# Patient Record
Sex: Male | Born: 1985 | Race: Black or African American | Hispanic: No | Marital: Single | Smoking: Current every day smoker
Health system: Southern US, Community
[De-identification: ages and names within clinical notes are randomized; demographics above are authoritative.]

---

## 2014-01-30 ENCOUNTER — Emergency Department: Payer: Self-pay | Admitting: Internal Medicine

## 2016-01-11 ENCOUNTER — Emergency Department: Payer: Self-pay

## 2016-01-11 DIAGNOSIS — Y280XXA Contact with sharp glass, undetermined intent, initial encounter: Secondary | ICD-10-CM | POA: Insufficient documentation

## 2016-01-11 DIAGNOSIS — F17200 Nicotine dependence, unspecified, uncomplicated: Secondary | ICD-10-CM | POA: Insufficient documentation

## 2016-01-11 DIAGNOSIS — Y9389 Activity, other specified: Secondary | ICD-10-CM | POA: Insufficient documentation

## 2016-01-11 DIAGNOSIS — S61412A Laceration without foreign body of left hand, initial encounter: Secondary | ICD-10-CM | POA: Insufficient documentation

## 2016-01-11 DIAGNOSIS — S61411A Laceration without foreign body of right hand, initial encounter: Secondary | ICD-10-CM | POA: Insufficient documentation

## 2016-01-11 DIAGNOSIS — Y998 Other external cause status: Secondary | ICD-10-CM | POA: Insufficient documentation

## 2016-01-11 DIAGNOSIS — Y9289 Other specified places as the place of occurrence of the external cause: Secondary | ICD-10-CM | POA: Insufficient documentation

## 2016-01-11 NOTE — ED Notes (Signed)
Pt with lac to right hand put it through glass, lac noted anterior left hand.  Has full movement and sensation of all fingers and hand.

## 2016-01-12 ENCOUNTER — Emergency Department
Admission: EM | Admit: 2016-01-12 | Discharge: 2016-01-12 | Disposition: A | Discharge status: Home / Self Care | Payer: Self-pay | Attending: Emergency Medicine | Admitting: Emergency Medicine

## 2016-01-12 ENCOUNTER — Encounter: Payer: Self-pay | Admitting: Emergency Medicine

## 2016-01-12 DIAGNOSIS — Y9289 Other specified places as the place of occurrence of the external cause: Secondary | ICD-10-CM | POA: Insufficient documentation

## 2016-01-12 DIAGNOSIS — S60413A Abrasion of left middle finger, initial encounter: Secondary | ICD-10-CM | POA: Insufficient documentation

## 2016-01-12 DIAGNOSIS — S60221A Contusion of right hand, initial encounter: Secondary | ICD-10-CM | POA: Insufficient documentation

## 2016-01-12 DIAGNOSIS — S61412A Laceration without foreign body of left hand, initial encounter: Secondary | ICD-10-CM | POA: Insufficient documentation

## 2016-01-12 DIAGNOSIS — S61401A Unspecified open wound of right hand, initial encounter: Secondary | ICD-10-CM | POA: Insufficient documentation

## 2016-01-12 DIAGNOSIS — F17200 Nicotine dependence, unspecified, uncomplicated: Secondary | ICD-10-CM | POA: Insufficient documentation

## 2016-01-12 DIAGNOSIS — Y998 Other external cause status: Secondary | ICD-10-CM | POA: Insufficient documentation

## 2016-01-12 DIAGNOSIS — S60415A Abrasion of left ring finger, initial encounter: Secondary | ICD-10-CM | POA: Insufficient documentation

## 2016-01-12 DIAGNOSIS — W25XXXA Contact with sharp glass, initial encounter: Secondary | ICD-10-CM | POA: Insufficient documentation

## 2016-01-12 DIAGNOSIS — Y9389 Activity, other specified: Secondary | ICD-10-CM | POA: Insufficient documentation

## 2016-01-12 MED ORDER — IBUPROFEN 800 MG PO TABS: 800.0000 mg | ORAL_TABLET | Freq: Three times a day (TID) | ORAL | Status: AC | PRN

## 2016-01-12 MED ORDER — MUPIROCIN CALCIUM 2 % EX CREA: TOPICAL_CREAM | CUTANEOUS | Status: AC

## 2016-01-12 NOTE — Discharge Instructions (Signed)
Cryotherapy °Cryotherapy means treatment with cold. Ice or gel packs can be used to reduce both pain and swelling. Ice is the most helpful within the first 24 to 48 hours after an injury or flare-up from overusing a muscle or joint. Sprains, strains, spasms, burning pain, shooting pain, and aches can all be eased with ice. Ice can also be used when recovering from surgery. Ice is effective, has very few side effects, and is safe for most people to use. °PRECAUTIONS  °Ice is not a safe treatment option for people with: °· Raynaud phenomenon. This is a condition affecting small blood vessels in the extremities. Exposure to cold may cause your problems to return. °· Cold hypersensitivity. There are many forms of cold hypersensitivity, including: °¨ Cold urticaria. Red, itchy hives appear on the skin when the tissues begin to warm after being iced. °¨ Cold erythema. This is a red, itchy rash caused by exposure to cold. °¨ Cold hemoglobinuria. Red blood cells break down when the tissues begin to warm after being iced. The hemoglobin that carry oxygen are passed into the urine because they cannot combine with blood proteins fast enough. °· Numbness or altered sensitivity in the area being iced. °If you have any of the following conditions, do not use ice until you have discussed cryotherapy with your caregiver: °· Heart conditions, such as arrhythmia, angina, or chronic heart disease. °· High blood pressure. °· Healing wounds or open skin in the area being iced. °· Current infections. °· Rheumatoid arthritis. °· Poor circulation. °· Diabetes. °Ice slows the blood flow in the region it is applied. This is beneficial when trying to stop inflamed tissues from spreading irritating chemicals to surrounding tissues. However, if you expose your skin to cold temperatures for too long or without the proper protection, you can damage your skin or nerves. Watch for signs of skin damage due to cold. °HOME CARE INSTRUCTIONS °Follow  these tips to use ice and cold packs safely. °· Place a dry or damp towel between the ice and skin. A damp towel will cool the skin more quickly, so you may need to shorten the time that the ice is used. °· For a more rapid response, add gentle compression to the ice. °· Ice for no more than 10 to 20 minutes at a time. The bonier the area you are icing, the less time it will take to get the benefits of ice. °· Check your skin after 5 minutes to make sure there are no signs of a poor response to cold or skin damage. °· Rest 20 minutes or more between uses. °· Once your skin is numb, you can end your treatment. You can test numbness by very lightly touching your skin. The touch should be so light that you do not see the skin dimple from the pressure of your fingertip. When using ice, most people will feel these normal sensations in this order: cold, burning, aching, and numbness. °· Do not use ice on someone who cannot communicate their responses to pain, such as small children or people with dementia. °HOW TO MAKE AN ICE PACK °Ice packs are the most common way to use ice therapy. Other methods include ice massage, ice baths, and cryosprays. Muscle creams that cause a cold, tingly feeling do not offer the same benefits that ice offers and should not be used as a substitute unless recommended by your caregiver. °To make an ice pack, do one of the following: °· Place crushed ice or a   bag of frozen vegetables in a sealable plastic bag. Squeeze out the excess air. Place this bag inside another plastic bag. Slide the bag into a pillowcase or place a damp towel between your skin and the bag.  Mix 3 parts water with 1 part rubbing alcohol. Freeze the mixture in a sealable plastic bag. When you remove the mixture from the freezer, it will be slushy. Squeeze out the excess air. Place this bag inside another plastic bag. Slide the bag into a pillowcase or place a damp towel between your skin and the bag. SEEK MEDICAL CARE  IF:  You develop white spots on your skin. This may give the skin a blotchy (mottled) appearance.  Your skin turns blue or pale.  Your skin becomes waxy or hard.  Your swelling gets worse. MAKE SURE YOU:   Understand these instructions.  Will watch your condition.  Will get help right away if you are not doing well or get worse.   This information is not intended to replace advice given to you by your health care provider. Make sure you discuss any questions you have with your health care provider.   Document Released: 07/08/2011 Document Revised: 12/02/2014 Document Reviewed: 07/08/2011 Elsevier Interactive Patient Education 2016 [employer]   Hand Contusion A hand contusion is a deep bruise on your hand area. Contusions are the result of an injury that caused bleeding under the skin. The contusion may turn blue, purple, or yellow. Minor injuries will give you a painless contusion, but more severe contusions may stay painful and swollen for a few weeks. CAUSES  A contusion is usually caused by a blow, trauma, or direct force to an area of the body. SYMPTOMS   Swelling and redness of the injured area.  Discoloration of the injured area.  Tenderness and soreness of the injured area.  Pain. DIAGNOSIS  The diagnosis can be made by taking a history and performing a physical exam. An X-ray, CT scan, or MRI may be needed to determine if there were any associated injuries, such as broken bones (fractures). TREATMENT  Often, the best treatment for a hand contusion is resting, elevating, icing, and applying cold compresses to the injured area. Over-the-counter medicines may also be recommended for pain control. HOME CARE INSTRUCTIONS   Put ice on the injured area.  Put ice in a plastic bag.  Place a towel between your skin and the bag.  Leave the ice on for 15-20 minutes, 03-04 times a day.  Only take over-the-counter or prescription medicines as directed by your  caregiver. Your caregiver may recommend avoiding anti-inflammatory medicines (aspirin, ibuprofen, and naproxen) for 48 hours because these medicines may increase bruising.  If told, use an elastic wrap as directed. This can help reduce swelling. You may remove the wrap for sleeping, showering, and bathing. If your fingers become numb, cold, or blue, take the wrap off and reapply it more loosely.  Elevate your hand with pillows to reduce swelling.  Avoid overusing your hand if it is painful. SEEK IMMEDIATE MEDICAL CARE IF:   You have increased redness, swelling, or pain in your hand.  Your swelling or pain is not relieved with medicines.  You have loss of feeling in your hand or are unable to move your fingers.  Your hand turns cold or blue.  You have pain when you move your fingers.  Your hand becomes warm to the touch.  Your contusion does not improve in 2 days. MAKE SURE YOU:  Understand these instructions.  Will watch your condition.  Will get help right away if you are not doing well or get worse.   This information is not intended to replace advice given to you by your health care provider. Make sure you discuss any questions you have with your health care provider.   Document Released: 05/03/2002 Document Revised: 08/05/2012 Document Reviewed: 05/04/2012 Elsevier Interactive Patient Education 2016 [employer]  Wound Care Taking care of your wound properly can help to prevent pain and infection. It can also help your wound to heal more quickly.  HOW TO CARE FOR YOUR WOUND  Take or apply over-the-counter and prescription medicines only as told by your health care provider.  If you were prescribed antibiotic medicine, take or apply it as told by your health care provider. Do not stop using the antibiotic even if your condition improves.  Clean the wound each day or as told by your health care provider.  Wash the wound with mild soap and water.  Rinse the wound  with water to remove all soap.  Pat the wound dry with a clean towel. Do not rub it.  There are many different ways to close and cover a wound. For example, a wound can be covered with stitches (sutures), skin glue, or adhesive strips. Follow instructions from your health care provider about:  How to take care of your wound.  When and how you should change your bandage (dressing).  When you should remove your dressing.  Removing whatever was used to close your wound.  Check your wound every day for signs of infection. Watch for:  Redness, swelling, or pain.  Fluid, blood, or pus.  Keep the dressing dry until your health care provider says it can be removed. Do not take baths, swim, use a hot tub, or do anything that would put your wound underwater until your health care provider approves.  Raise (elevate) the injured area above the level of your heart while you are sitting or lying down.  Do not scratch or pick at the wound.  Keep all follow-up visits as told by your health care provider. This is important. SEEK MEDICAL CARE IF:  You received a tetanus shot and you have swelling, severe pain, redness, or bleeding at the injection site.  You have a fever.  Your pain is not controlled with medicine.  You have increased redness, swelling, or pain at the site of your wound.  You have fluid, blood, or pus coming from your wound.  You notice a bad smell coming from your wound or your dressing. SEEK IMMEDIATE MEDICAL CARE IF:  You have a red streak going away from your wound.   This information is not intended to replace advice given to you by your health care provider. Make sure you discuss any questions you have with your health care provider.   Document Released: 08/20/2008 Document Revised: 03/28/2015 Document Reviewed: 11/07/2014 Elsevier Interactive Patient Education [employer]
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
## 2019-06-24 ENCOUNTER — Emergency Department: Payer: Self-pay

## 2019-06-24 ENCOUNTER — Encounter: Payer: Self-pay | Admitting: Emergency Medicine

## 2019-06-24 ENCOUNTER — Other Ambulatory Visit: Payer: Self-pay

## 2019-06-24 ENCOUNTER — Emergency Department
Admission: EM | Admit: 2019-06-24 | Discharge: 2019-06-24 | Disposition: A | Discharge status: Home / Self Care | Payer: Self-pay | Attending: Student in an Organized Health Care Education/Training Program | Admitting: Student in an Organized Health Care Education/Training Program

## 2019-06-24 DIAGNOSIS — Z20828 Contact with and (suspected) exposure to other viral communicable diseases: Secondary | ICD-10-CM | POA: Insufficient documentation

## 2019-06-24 DIAGNOSIS — R51 Headache: Secondary | ICD-10-CM | POA: Insufficient documentation

## 2019-06-24 DIAGNOSIS — F17200 Nicotine dependence, unspecified, uncomplicated: Secondary | ICD-10-CM | POA: Insufficient documentation

## 2019-06-24 DIAGNOSIS — R519 Headache, unspecified: Secondary | ICD-10-CM

## 2019-06-24 LAB — CBC WITH DIFFERENTIAL/PLATELET
Abs Immature Granulocytes: 0.05 10*3/uL (ref 0.00–0.07)
Basophils Absolute: 0 10*3/uL (ref 0.0–0.1)
Basophils Relative: 0 %
Eosinophils Absolute: 0 10*3/uL (ref 0.0–0.5)
Eosinophils Relative: 0 %
HCT: 41.7 % (ref 39.0–52.0)
Hemoglobin: 14.1 g/dL (ref 13.0–17.0)
Immature Granulocytes: 0 %
Lymphocytes Relative: 12 %
Lymphs Abs: 1.4 10*3/uL (ref 0.7–4.0)
MCH: 31.7 pg (ref 26.0–34.0)
MCHC: 33.8 g/dL (ref 30.0–36.0)
MCV: 93.7 fL (ref 80.0–100.0)
Monocytes Absolute: 0.6 10*3/uL (ref 0.1–1.0)
Monocytes Relative: 5 %
Neutro Abs: 9.8 10*3/uL — ABNORMAL HIGH (ref 1.7–7.7)
Neutrophils Relative %: 83 %
Platelets: 281 10*3/uL (ref 150–400)
RBC: 4.45 MIL/uL (ref 4.22–5.81)
RDW: 11.9 % (ref 11.5–15.5)
WBC: 11.9 10*3/uL — ABNORMAL HIGH (ref 4.0–10.5)
nRBC: 0 % (ref 0.0–0.2)

## 2019-06-24 LAB — COMPREHENSIVE METABOLIC PANEL
ALT: 53 U/L — ABNORMAL HIGH (ref 0–44)
AST: 49 U/L — ABNORMAL HIGH (ref 15–41)
Albumin: 4.4 g/dL (ref 3.5–5.0)
Alkaline Phosphatase: 42 U/L (ref 38–126)
Anion gap: 9 (ref 5–15)
BUN: 10 mg/dL (ref 6–20)
CO2: 22 mmol/L (ref 22–32)
Calcium: 9.2 mg/dL (ref 8.9–10.3)
Chloride: 108 mmol/L (ref 98–111)
Creatinine, Ser: 1.25 mg/dL — ABNORMAL HIGH (ref 0.61–1.24)
GFR calc Af Amer: >60 mL/min (ref >60)
GFR calc non Af Amer: >60 mL/min (ref >60)
Glucose, Bld: 93 mg/dL (ref 70–99)
Potassium: 3.2 mmol/L — ABNORMAL LOW (ref 3.5–5.1)
Sodium: 139 mmol/L (ref 135–145)
Total Bilirubin: 1.3 mg/dL — ABNORMAL HIGH (ref 0.3–1.2)
Total Protein: 7.1 g/dL (ref 6.5–8.1)

## 2019-06-24 LAB — SARS CORONAVIRUS 2 BY RT PCR (HOSPITAL ORDER, PERFORMED IN ~~LOC~~ HOSPITAL LAB): SARS Coronavirus 2: NEGATIVE

## 2019-06-24 MED ORDER — PROCHLORPERAZINE EDISYLATE 10 MG/2ML IJ SOLN
10.0000 mg | Freq: Once | INTRAMUSCULAR | Status: AC
  Administered 2019-06-24: 10 mg via INTRAVENOUS
  Filled 2019-06-24: qty 2

## 2019-06-24 MED ORDER — ACETAMINOPHEN 500 MG PO TABS
1000.0000 mg | ORAL_TABLET | Freq: Once | ORAL | Status: AC
  Administered 2019-06-24: 1000 mg via ORAL
  Filled 2019-06-24: qty 2

## 2019-06-24 MED ORDER — IOHEXOL 350 MG/ML SOLN
75.0000 mL | Freq: Once | INTRAVENOUS | Status: AC | PRN
  Administered 2019-06-24: 16:00:00 75 mL via INTRAVENOUS

## 2019-06-24 MED ORDER — SODIUM CHLORIDE 0.9 % IV BOLUS
1000.0000 mL | Freq: Once | INTRAVENOUS | Status: AC
Start: 2019-06-24 — End: 2019-06-24
  Administered 2019-06-24: 15:00:00 1000 mL via INTRAVENOUS

## 2019-06-24 MED ORDER — DIPHENHYDRAMINE HCL 50 MG/ML IJ SOLN
12.5000 mg | Freq: Once | INTRAMUSCULAR | Status: AC
  Administered 2019-06-24: 15:00:00 12.5 mg via INTRAVENOUS
  Filled 2019-06-24: qty 1

## 2019-06-24 NOTE — Discharge Instructions (Addendum)

## 2019-06-24 NOTE — ED Provider Notes (Signed)
[facility] Emergency Department Provider Note    None    (approximate)  I have reviewed the triage vital signs and the nursing notes.   HISTORY  Chief Complaint Headache and Anxiety    HPI Thurston HoleMartiece Gionfriddo is a [age] y.o. male no significant past medical history presents to the ER for evaluation of severe headache.  States that its across the front of his head.  States he had a mild headache earlier this morning then it became more severe.  States he started hyperventilating having cramping in bilateral upper extremities.  Was having blurry vision.  Denies any history of migraines.  No family history of asthma.  He is not any blood thinners.  Denies any trauma.  His girlfriend was recently sick and was tested for COVID-19 but was negative.  denies any neck stiffness.    History reviewed. No pertinent past medical history. No family history on file. History reviewed. No pertinent surgical history. There are no active problems to display for this patient.     Prior to Admission medications   Medication Sig Start Date End Date Taking? Authorizing Provider  ibuprofen (ADVIL,MOTRIN) 800 MG tablet Take 1 tablet (800 mg total) by mouth every 8 (eight) hours as needed (pain). 01/12/16   Hagler, Jami L, PA-C  mupirocin cream (BACTROBAN) 2 % Apply to affected area 2 times daily in a thin layer as needed 01/12/16   Hagler, Jami L, PA-C    Allergies Patient has no known allergies.    Social History Social History   Tobacco Use  . Smoking status: Current Every Day Smoker  Substance Use Topics  . Alcohol use: No  . Drug use: Not on file    Review of Systems Patient denies headaches, rhinorrhea, blurry vision, numbness, shortness of breath, chest pain, edema, cough, abdominal pain, nausea, vomiting, diarrhea, dysuria, fevers, rashes or hallucinations unless otherwise stated above in HPI. ____________________________________________   PHYSICAL EXAM:  VITAL  SIGNS: Vitals:   06/24/19 1414 06/24/19 1638  BP: (!) 152/85 (!) 142/106  Pulse: (!) 102 72  Resp: (!) 24 16  Temp: 98.1 F (36.7 C)   SpO2: 100% 99%    Constitutional: Alert and oriented.  Eyes: Conjunctivae are normal.  Head: Atraumatic. Nose: No congestion/rhinnorhea. Mouth/Throat: Mucous membranes are moist.   Neck: No stridor. Painless ROM.  Cardiovascular: Normal rate, regular rhythm. Grossly normal heart sounds.  Good peripheral circulation. Respiratory: Normal respiratory effort.  No retractions. Lungs CTAB. Gastrointestinal: Soft and nontender. No distention. No abdominal bruits. No CVA tenderness. Genitourinary:  Musculoskeletal: No lower extremity tenderness nor edema.  No joint effusions. Neurologic:  CN- intact.  No facial droop, Normal FNF.  Normal heel to shin.  Sensation intact bilaterally. Normal speech and language. No gross focal neurologic deficits are appreciated. No gait instability. Skin:  Skin is warm, dry and intact. No rash noted. Psychiatric: Mood and affect are normal. Speech and behavior are normal.  ____________________________________________   LABS (all labs ordered are listed, but only abnormal results are displayed)  Results for orders placed or performed during the hospital encounter of 06/24/19 (from the past 24 hour(s))  CBC with Differential/Platelet     Status: Abnormal   Collection Time: 06/24/19  2:53 PM  Result Value Ref Range   WBC 11.9 (H) 4.0 - 10.5 K/uL   RBC 4.45 4.22 - 5.81 MIL/uL   Hemoglobin 14.1 13.0 - 17.0 g/dL   HCT 21.341.7 08.639.0 - 57.852.0 %   MCV 93.7  80.0 - 100.0 fL   MCH 31.7 26.0 - 34.0 pg   MCHC 33.8 30.0 - 36.0 g/dL   RDW 29.511.9 62.111.5 - 30.815.5 %   Platelets 281 150 - 400 K/uL   nRBC 0.0 0.0 - 0.2 %   Neutrophils Relative % 83 %   Neutro Abs 9.8 (H) 1.7 - 7.7 K/uL   Lymphocytes Relative 12 %   Lymphs Abs 1.4 0.7 - 4.0 K/uL   Monocytes Relative 5 %   Monocytes Absolute 0.6 0.1 - 1.0 K/uL   Eosinophils Relative 0 %    Eosinophils Absolute 0.0 0.0 - 0.5 K/uL   Basophils Relative 0 %   Basophils Absolute 0.0 0.0 - 0.1 K/uL   Immature Granulocytes 0 %   Abs Immature Granulocytes 0.05 0.00 - 0.07 K/uL  Comprehensive metabolic panel     Status: Abnormal   Collection Time: 06/24/19  2:53 PM  Result Value Ref Range   Sodium 139 135 - 145 mmol/L   Potassium 3.2 (L) 3.5 - 5.1 mmol/L   Chloride 108 98 - 111 mmol/L   CO2 22 22 - 32 mmol/L   Glucose, Bld 93 70 - 99 mg/dL   BUN 10 6 - 20 mg/dL   Creatinine, Ser 6.571.25 (H) 0.61 - 1.24 mg/dL   Calcium 9.2 8.9 - 84.610.3 mg/dL   Total Protein 7.1 6.5 - 8.1 g/dL   Albumin 4.4 3.5 - 5.0 g/dL   AST 49 (H) 15 - 41 U/L   ALT 53 (H) 0 - 44 U/L   Alkaline Phosphatase 42 38 - 126 U/L   Total Bilirubin 1.3 (H) 0.3 - 1.2 mg/dL   GFR calc non Af Amer >60 >60 mL/min   GFR calc Af Amer >60 >60 mL/min   Anion gap 9 5 - 15  SARS Coronavirus 2 (CEPHEID- Performed in [facility] lab), Hosp Order     Status: None   Collection Time: 06/24/19  2:53 PM   Specimen: Nasopharyngeal Swab  Result Value Ref Range   SARS Coronavirus 2 NEGATIVE NEGATIVE   ____________________________________________ ____________________________________________  RADIOLOGY  I personally reviewed all radiographic images ordered to evaluate for the above acute complaints and reviewed radiology reports and findings.  These findings were personally discussed with the patient.  Please see medical record for radiology report.  ____________________________________________   PROCEDURES  Procedure(s) performed:  Procedures    Critical Care performed: no ____________________________________________   INITIAL IMPRESSION / ASSESSMENT AND PLAN / ED COURSE  Pertinent labs & imaging results that were available during my care of the patient were reviewed by me and considered in my medical decision making (see chart for details).   DDX: SAH, migraine, tension, cluster,   Ravinder Lorenda HatchetSlade is a 33 y.o.  who presents to the ED with severe headache as described above.  He is neuro intact but is reporting worse headache of his life.  Will order CT and CTA to exclude aneurysm or bleed.  Will provide IV pain medication.   CTA does not show any evidence of aneurysm, bleed malformation or tumor.  He feels significantly improved.  Likely tension headache or migraine.  Patient able to ambulate steady gait.  Repeat neuro exam is nonfocal.  Stable and appropriate for outpatient follow-up.  The patient was evaluated in Emergency Department today for the symptoms described in the history of present illness. He/she was evaluated in the context of the global COVID-19 pandemic, which necessitated consideration that the patient might be at risk for  infection with the SARS-CoV-2 virus that causes COVID-19. Institutional protocols and algorithms that pertain to the evaluation of patients at risk for COVID-19 are in a state of rapid change based on information released by regulatory bodies including the CDC and federal and state organizations. These policies and algorithms were followed during the patient's care in the ED.  As part of my medical decision making, I reviewed the following data within the [place] notes reviewed and incorporated, Labs reviewed, notes from prior ED visits and [place] Controlled Substance Database   ____________________________________________   FINAL CLINICAL IMPRESSION(S) / ED DIAGNOSES  Final diagnoses:  Bad headache      NEW MEDICATIONS STARTED DURING THIS VISIT:  Discharge Medication List as of 06/24/2019  4:24 PM       Note:  This document was prepared using Dragon voice recognition software and may include unintentional dictation errors.    Merlyn Lot, MD 06/24/19 2102

## 2019-06-24 NOTE — ED Notes (Signed)
Patient transported to CT 

## 2019-06-24 NOTE — ED Triage Notes (Signed)
Patient from home via [facility]. Per EMS, patient developed a severe headache at approximately 9 am this morning. Patient then became very anxious and began to hyperventilate. Upon EMS arrival, patient complaining of cramping in bilateral hands as well as numbness and tingling in arms and face. EMS able to help patient regulate breathing. Upon arrival to ED, patient is breathing more normally and reports improvement of headache as well as cramping and tingling. Patient denies history of headaches or anxiety.

## 2020-03-12 IMAGING — CT CT ANGIOGRAPHY HEAD
3 of 10 series · 17 of 47 positions shown · IV contrast (APPLIED)
Comparison: None.

CLINICAL DATA: Worst headache of life.

EXAM:
CT ANGIOGRAPHY HEAD
TECHNIQUE: Multidetector CT imaging of the head was performed using the
standard protocol during bolus administration of intravenous
contrast. Multiplanar CT image reconstructions and MIPs were
obtained to evaluate the vascular anatomy.
CONTRAST:  75mL OMNIPAQUE IOHEXOL 350 MG/ML SOLN

[Series 10: ax thin · axial · 0.36mm/px · z∈[-147,-23]mm · 11 of 144 slices shown]
[im 10/144  brain]
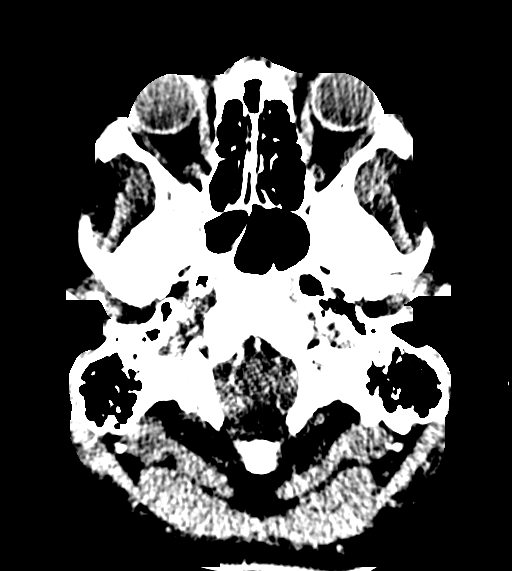
[im 20/144  bone]
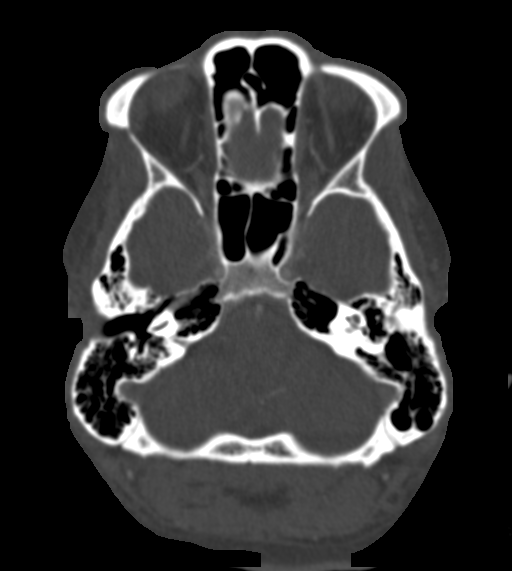
[im 39/144  brain]
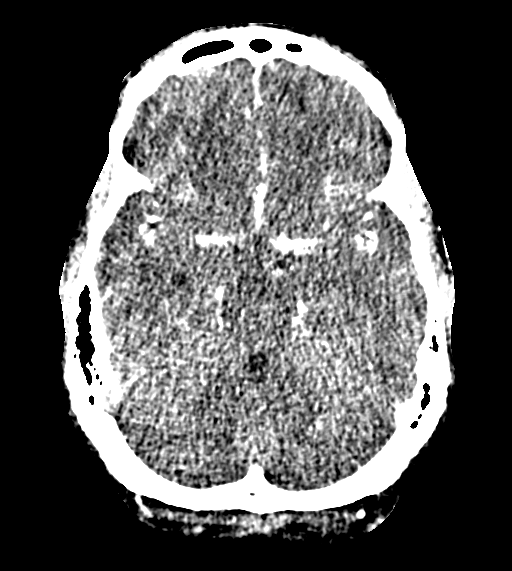
[im 48/144  bone]
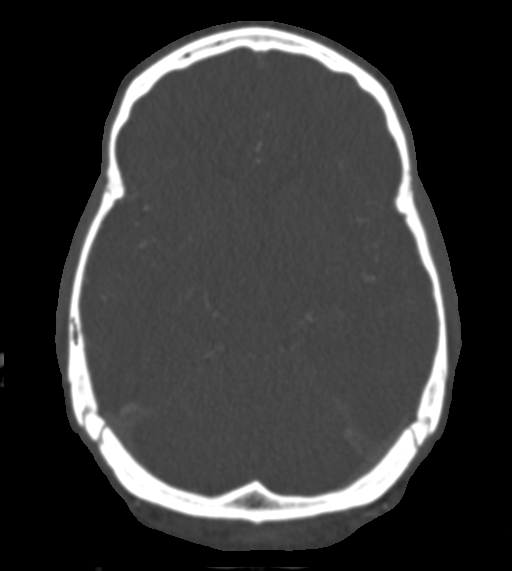
[im 58/144  brain]
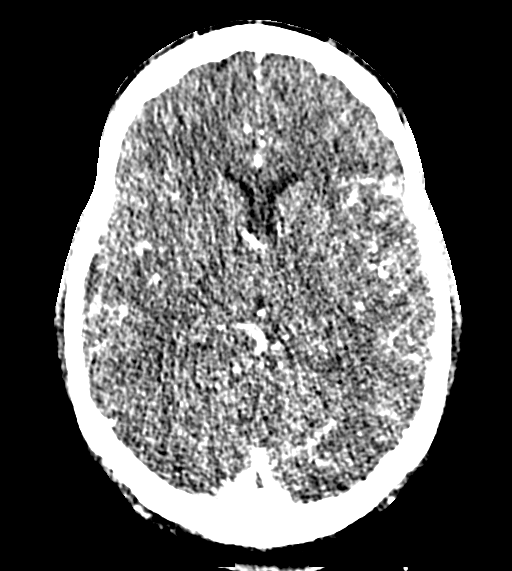
[im 77/144  bone]
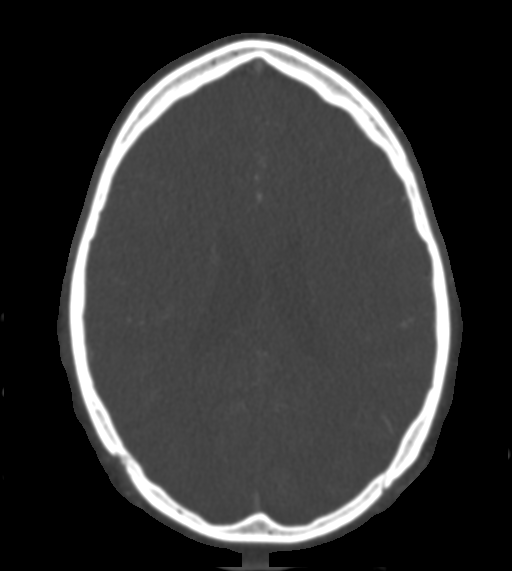
[im 86/144  brain]
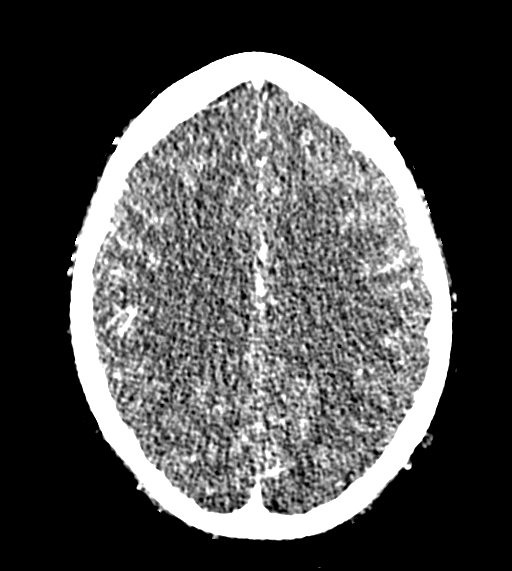
[im 96/144  bone]
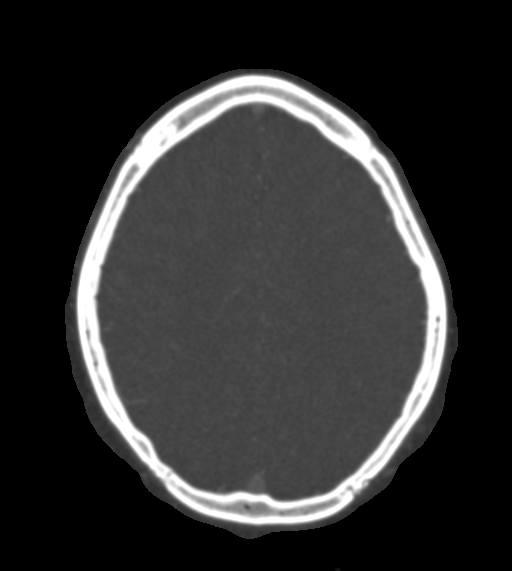
[im 105/144  brain]
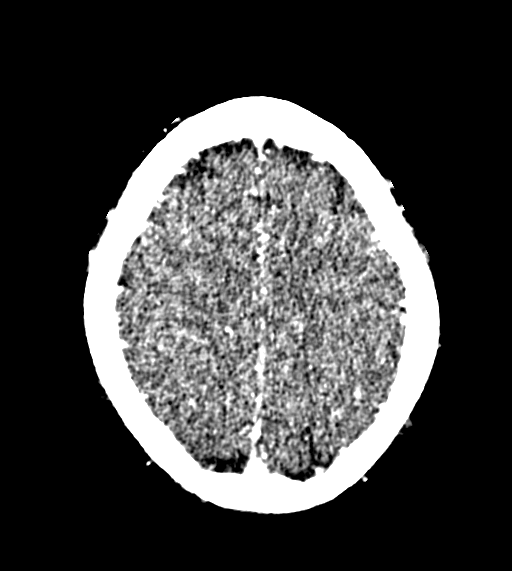
[im 124/144  bone]
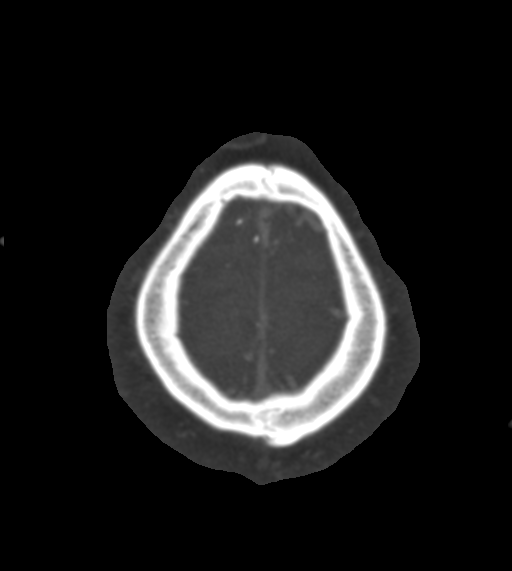
[im 134/144  brain]
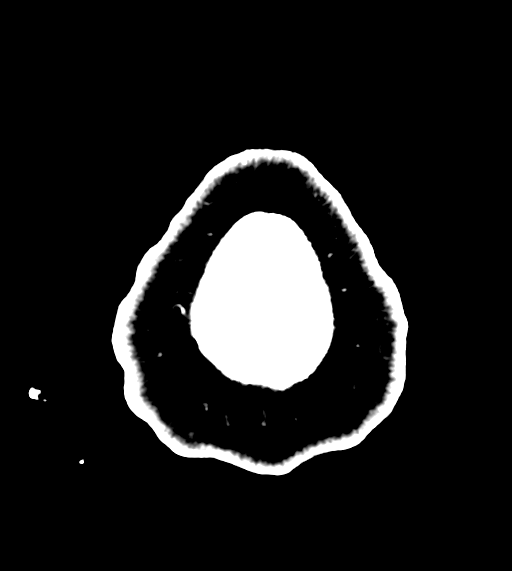

[Series 12: cor thin · coronal · 0.31mm/px · 3 of 204 slices shown]
[im 68/204  brain]
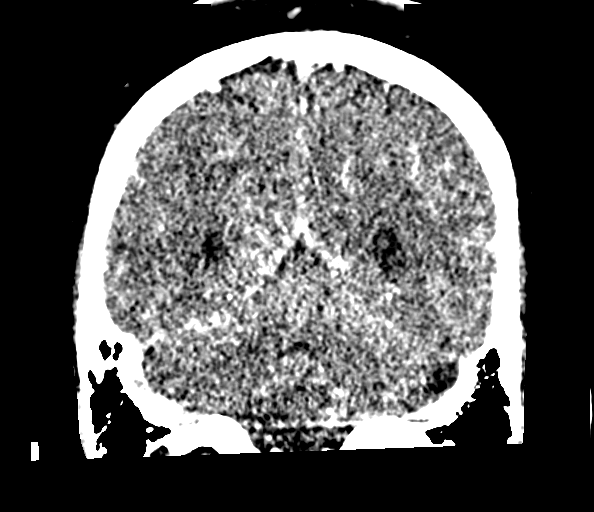
[im 102/204  brain]
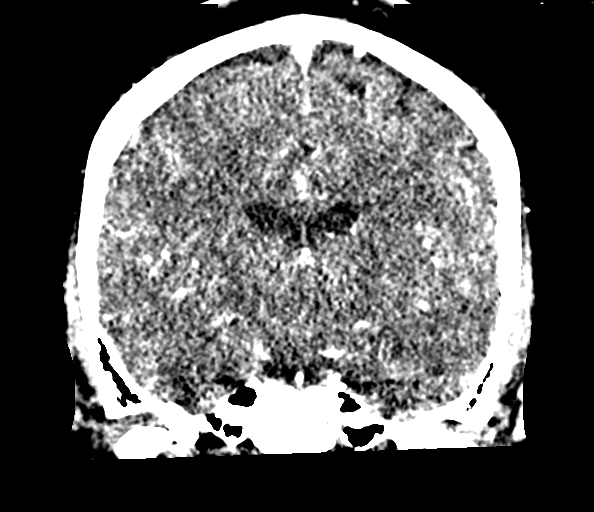
[im 136/204  brain]
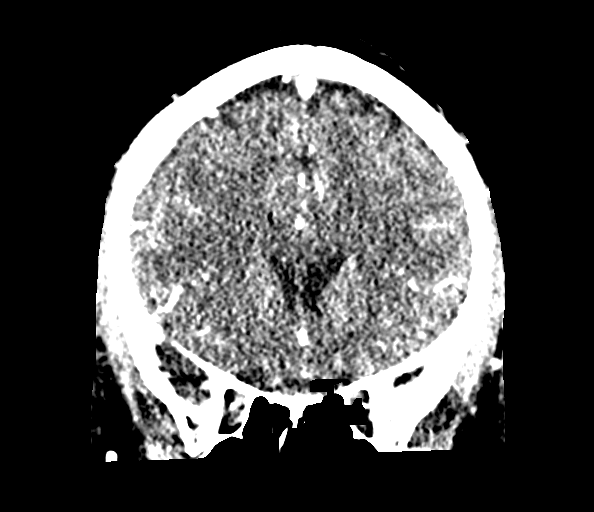

[Series 14: sag thin · sagittal · 0.31mm/px · 3 of 182 slices shown]
[im 46/182  brain]
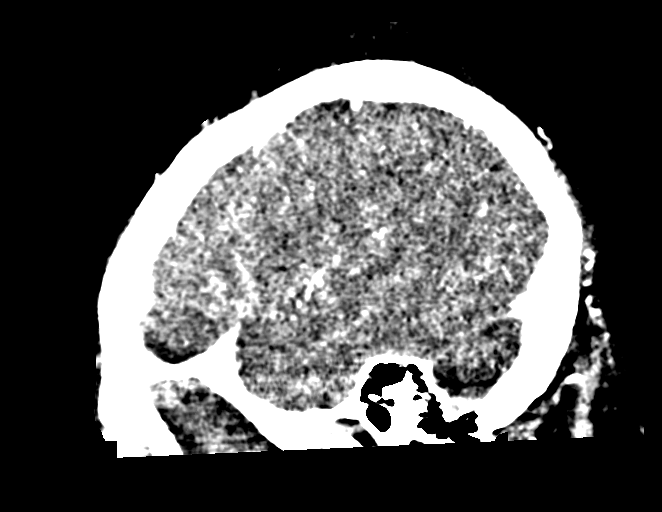
[im 91/182  brain]
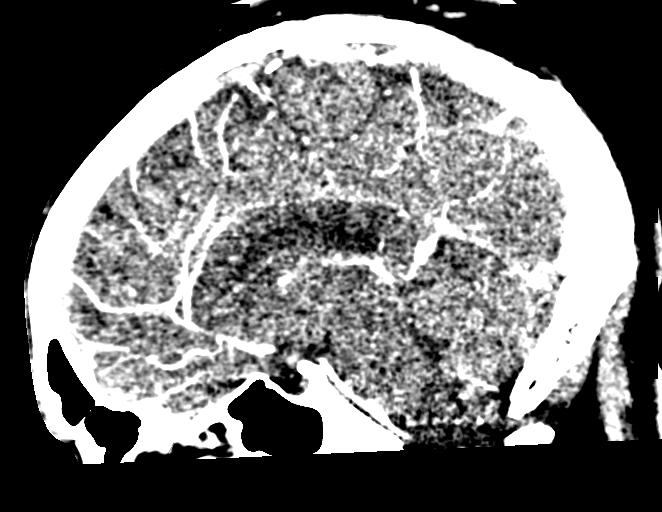
[im 136/182  brain]
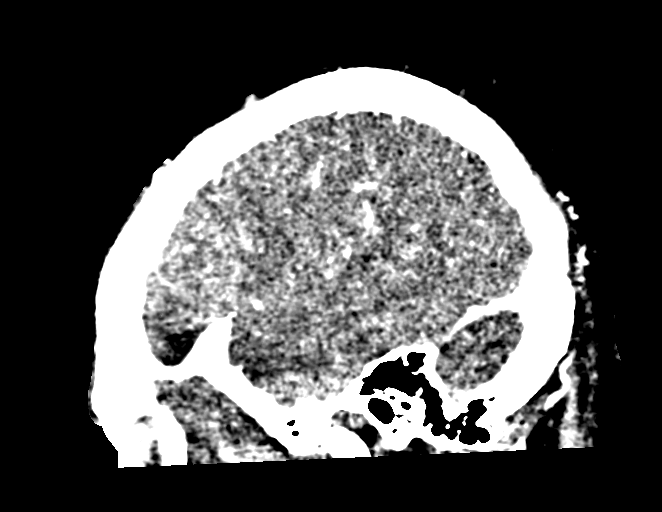

[17 of 47 positions shown; findings below may reference images not displayed]

FINDINGS: CT HEAD

Brain: No evidence of acute infarction, hemorrhage, hydrocephalus,
extra-axial collection or mass lesion/mass effect.

Vascular: Negative for hyperdense vessel

Skull: Negative

Sinuses: Negative

Orbits: Negative

CTA HEAD

Anterior circulation: Cavernous carotid widely patent without
stenosis or aneurysm. Anterior and middle cerebral arteries normal

Posterior circulation: Both vertebral arteries patent to the
basilar. PICA patent bilaterally. Basilar widely patent. Superior
cerebellar and posterior cerebral arteries patent bilaterally
without stenosis. Fetal origin left posterior cerebral artery

Venous sinuses: Normal venous enhancement.

Anatomic variants: Negative for cerebral aneurysm
IMPRESSION: Normal CT head

Normal CTA head

Negative for vascular malformation or hemorrhage.
